# Patient Record
Sex: Male | Born: 2000 | Race: Black or African American | Hispanic: No | Marital: Single | Smoking: Never smoker
Health system: Southern US, Community
[De-identification: ages and names within clinical notes are randomized; demographics above are authoritative.]

## PROBLEM LIST (undated history)

## (undated) DIAGNOSIS — D573 Sickle-cell trait: Secondary | ICD-10-CM

---
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
## 2020-05-18 ENCOUNTER — Encounter: Payer: Self-pay | Admitting: Internal Medicine

## 2020-06-02 ENCOUNTER — Other Ambulatory Visit: Payer: Self-pay

## 2020-06-02 ENCOUNTER — Encounter: Payer: Self-pay | Admitting: Internal Medicine

## 2020-06-02 ENCOUNTER — Ambulatory Visit (INDEPENDENT_AMBULATORY_CARE_PROVIDER_SITE_OTHER): Payer: No Typology Code available for payment source | Admitting: Internal Medicine

## 2020-06-02 VITALS — Temp 98.8°F | Ht 76.0 in | Wt 189.0 lb

## 2020-06-02 DIAGNOSIS — Z8616 Personal history of COVID-19: Secondary | ICD-10-CM

## 2020-06-02 DIAGNOSIS — R509 Fever, unspecified: Secondary | ICD-10-CM | POA: Diagnosis not present

## 2020-06-02 DIAGNOSIS — D573 Sickle-cell trait: Secondary | ICD-10-CM

## 2020-06-02 NOTE — Progress Notes (Signed)
Virtual Visit via Video   This visit type was conducted due to national recommendations for restrictions regarding the COVID-19 Pandemic (e.g. social distancing) in an effort to limit this patient's exposure and mitigate transmission in our community.  Due to his co-morbid illnesses, this patient is at least at moderate risk for complications without adequate follow up.  This format is felt to be most appropriate for this patient at this time.  All issues noted in this document were discussed and addressed.  A limited physical exam was performed with this format.    This visit type was conducted due to national recommendations for restrictions regarding the COVID-19 Pandemic (e.g. social distancing) in an effort to limit this patient's exposure and mitigate transmission in our community.  Patients identity confirmed using two different identifiers.  This format is felt to be most appropriate for this patient at this time.  All issues noted in this document were discussed and addressed.  No physical exam was performed (except for noted visual exam findings with Video Visits).    Date:  06/02/2020   ID:  Charles Dillon, DOB 24-Apr-2000, MRN [id number]  Patient Location:  Home  Provider location:   Office    Chief Complaint:  "I need f/u after COVID"  History of Present Illness:    Charles Dillon is a 20 y.o. male who presents via video conferencing for a telehealth visit today.    The patient does not have symptoms concerning for COVID-19 infection (fever, chills, cough, or new shortness of breath).   He presents today for virtual visit, per his request. He is now living in [place], [place]. The patient is being seeing today for a covid f/u. He reports he got sick on 4/17 - he had to leave work early because he felt hot, chills, headachy and lightheaded. He went home and checked his temp the next day and temperature was 101. He went to [facility] and had COVID test which was  reportedly positive. He stayed out of work from 4/18 through 4/24. He did return to work on 4/25 but states he needs a return to work note. Since this illness, he has felt fine and without lingering symptoms.     Past Medical History:  Diagnosis Date  . Sickle cell trait ([facility])    History reviewed. No pertinent surgical history.   No outpatient medications have been marked as taking for the 06/02/20 encounter (Office Visit) with Dorothyann Peng, MD.     Allergies:   Patient has no known allergies.   Social History   Tobacco Use  . Smoking status: Never Smoker  . Smokeless tobacco: Never Used  Vaping Use  . Vaping Use: Never used  Substance Use Topics  . Alcohol use: No  . Drug use: Never     Family Hx: The patient's family history includes Early death in his father; Hypertension in his mother; Hyperthyroidism in his mother; Sickle cell anemia in his maternal grandmother.  ROS:   Please see the history of present illness.    Review of Systems  Constitutional: Negative.   Respiratory: Negative.   Cardiovascular: Negative.   Gastrointestinal: Negative.   Neurological: Negative.   Psychiatric/Behavioral: Negative.     All other systems reviewed and are negative.   Labs/Other Tests and Data Reviewed:    Recent Labs: No results found for requested labs within last 8760 hours.   Recent Lipid Panel No results found for: CHOL, TRIG, HDL, CHOLHDL, LDLCALC, LDLDIRECT  Wt Readings from Last 3 Encounters:  06/02/20 189 lb (85.7 kg) (87 %, Z= 1.12)*  09/17/19 178 lb (80.7 kg) (81 %, Z= 0.88)*  08/27/18 181 lb (82.1 kg) (87 %, Z= 1.11)*   * Growth percentiles are based on CDC (Boys, 2-20 Years) data.     Exam:    Vital Signs:  Temp 98.8 F (37.1 C) Comment: patient provided  Ht 6\' 4"  (1.93 m)   Wt 189 lb (85.7 kg) Comment: patient provided  BMI 23.01 kg/m     Physical Exam Vitals and nursing note reviewed.  Constitutional:      Appearance: Normal appearance.   HENT:     Head: Normocephalic and atraumatic.  Pulmonary:     Effort: Pulmonary effort is normal.  Musculoskeletal:     Cervical back: Normal range of motion.  Neurological:     Mental Status: He is alert and oriented to person, place, and time.  Psychiatric:        Mood and Affect: Affect normal.     ASSESSMENT & PLAN:    1. Febrile illness Comments: This has since resolved. I will request records from [facility] to include in his chart. I agree w/ previous mgmt.  He should start taking Vitamin D 2000 units daily along with vitamin C 1000mg  daily.   2. Sickle cell trait (HCC) Chronic, stable.    COVID-19 Education: The signs and symptoms of COVID-19 were discussed with the patient and how to seek care for testing (follow up with PCP or arrange E-visit).  The importance of social distancing was discussed today.  Patient Risk:   After full review of this patients clinical status, I feel that they are at least moderate risk at this time.  Time:   Today, I have spent 20 minutes/ seconds with the patient with telehealth technology discussing above diagnoses.     Medication Adjustments/Labs and Tests Ordered: Current medicines are reviewed at length with the patient today.  Concerns regarding medicines are outlined above.   Tests Ordered: No orders of the defined types were placed in this encounter.   Medication Changes: No orders of the defined types were placed in this encounter.   Disposition:  Follow up prn  Signed, , MD

## 2020-06-27 ENCOUNTER — Encounter: Payer: Self-pay | Admitting: Physician Assistant

## 2020-06-27 ENCOUNTER — Telehealth: Payer: No Typology Code available for payment source | Admitting: Physician Assistant

## 2020-06-27 DIAGNOSIS — Z20822 Contact with and (suspected) exposure to covid-19: Secondary | ICD-10-CM

## 2020-06-27 DIAGNOSIS — J029 Acute pharyngitis, unspecified: Secondary | ICD-10-CM

## 2020-06-27 NOTE — Progress Notes (Signed)
This appears to be a duplicate visit.   NOTE: If you entered your credit card information for this eVisit, you will not be charged. You may see a "hold" on your card for the $35 but that hold will drop off and you will not have a charge processed.   If you are having a true medical emergency please call 911.      For an urgent face to face visit, [place] has six urgent care centers for your convenience:     [facility] at [facility] Directions [phone] [address] [place], [place] [address] . 8 am - 4 pm Monday - Friday    [facility]) Get Driving Directions [phone] [address] [place], [place] [address] . 8 am to 8 pm Monday-Friday . 10 am to 6 pm [facility] Urgent [facility]) Get Driving Directions [phone]  [address] [place],  [place]  [address] . 8 am to 8 pm Monday-Friday . 8 am to 4 pm [facility] Urgent Care at [facility] Get Driving Directions [phone] [address] [place], [place] [address] . 8 am to 8 pm Monday-Friday . 8 am to 4 pm [facility]gent Care at [facility] Get Driving Directions  [phone] [address] [place], [place] [address] . 8 am to 8 pm Monday-Friday . 8 am to 4 pm [facility] Urgent Care at [facility] Directions [phone] [address], [address] [place], [place] [address] . 8 am to 8 pm Monday-Friday . 8 am to 4 pm Saturday-Sunday     Your MyChart E-visit questionnaire answers were reviewed by a board certified advanced clinical practitioner to complete your personal care plan based on your specific symptoms.  Thank you for using e-Visits.    I spent 5-10 minutes on review and completion of this note- Illa Level [facility]

## 2020-06-27 NOTE — Progress Notes (Signed)
E-Visit for Corona Virus Screening  Your current symptoms could be consistent with the coronavirus.  Many health care providers can now test patients at their office but not all are.  [facility] has multiple testing sites. For information on our COVID testing locations and hours go to [email or web address]  We are enrolling you in our MyChart Home Monitoring for COVID19 . Daily you will receive a questionnaire within the MyChart website. Our COVID 19 response team will be monitoring your responses daily.  Testing Information: The COVID-19 Community Testing sites are testing BY APPOINTMENT ONLY.  You can schedule online at [email or web address]  If you do not have access to a smart phone or computer you may call [phone] for an appointment.   Additional testing sites in the Community:  . For [facility] Testing sites in [facility]  FarmerBuys.com.au  . For Pop-up testing sites in [place]  [email or web address]  . For [facility] [email or web address]  . For [facility] testing in [place] and [employer] [email or web address]  . For [facility] testing in [facility]   [email or web address]  For  more information about community testing call [phone]   Please quarantine yourself while awaiting your test results. Please stay home for a minimum of 10 days from the first day of illness with improving symptoms and you have had 24 hours of no fever (without the use of Tylenol (Acetaminophen) Motrin (Ibuprofen) or any fever reducing medication).  Also - Do not get tested prior to returning to work because once you have had a positive test the test can stay  positive for more than a month in some cases.   You should wear a mask or cloth face covering over your nose and mouth if you must be around other people or animals, including pets (even at home). Try to stay at least 6 feet away from other people. This will protect the people around you.  Please continue good preventive care measures, including:  frequent hand-washing, avoid touching your face, cover coughs/sneezes, stay out of crowds and keep a 6 foot distance from others.  COVID-19 is a respiratory illness with symptoms that are similar to the flu. Symptoms are typically mild to moderate, but there have been cases of severe illness and death due to the virus.   The following symptoms may appear 2-14 days after exposure: . Fever . Cough . Shortness of breath or difficulty breathing . Chills . Repeated shaking with chills . Muscle pain . Headache . Sore throat . New loss of taste or smell . Fatigue . Congestion or runny nose . Nausea or vomiting . Diarrhea  Go to the nearest hospital ED for assessment if fever/cough/breathlessness are severe or illness seems like a threat to life.  It is vitally important that if you feel that you have an infection such as this virus or any other virus that you stay home and away from places where you may spread it to others.  You should avoid contact with people age 51 and older.        You may also take acetaminophen (Tylenol) as needed for fever.    Your symptoms indicate a likely viral infection (Pharyngitis).   Pharyngitis is inflammation in the back of the throat which can cause a sore throat, scratchiness and sometimes difficulty swallowing.   Pharyngitis is typically caused by a respiratory virus and will just run its course.  Please keep in mind that your symptoms could last up to 10 days.  For throat  pain, we recommend over the counter oral pain relief medications such as acetaminophen or aspirin, or anti-inflammatory medications such as  ibuprofen or naproxen sodium.  Topical treatments such as oral throat lozenges or sprays may be used as needed.  Avoid close contact with loved ones, especially the very young and elderly.  Remember to wash your hands thoroughly throughout the day as this is the number one way to prevent the spread of infection and wipe down door knobs and counters with disinfectant. If your sore throat continues please submit an Evisit, or have a face to face visit for further evaluation.    After careful review of your answers, I would not recommend and antibiotic for your condition.  Antibiotics should not be used to treat conditions that we suspect are caused by viruses like the virus that causes the common cold or flu. However, some people can have Strep with atypical symptoms. You may need formal testing in clinic or office to confirm if your symptoms continue or worsen.  Providers prescribe antibiotics to treat infections caused by bacteria. Antibiotics are very powerful in treating bacterial infections when they are used properly.  To maintain their effectiveness, they should be used only when necessary.  Overuse of antibiotics has resulted in the development of super bugs that are resistant to treatment!    I have also provided a work note.      Reduce your risk of any infection by using the same precautions used for avoiding the common cold or flu:  Marland Kitchen Wash your hands often with soap and warm water for at least 20 seconds.  If soap and water are not readily available, use an alcohol-based hand sanitizer with at least 60% alcohol.  . If coughing or sneezing, cover your mouth and nose by coughing or sneezing into the elbow areas of your shirt or coat, into a tissue or into your sleeve (not your hands). . Avoid shaking hands with others and consider head nods or verbal greetings only. . Avoid touching your eyes, nose, or mouth with unwashed hands.  . Avoid close contact with people who are sick. . Avoid  places or events with large numbers of people in one location, like concerts or sporting events. . Carefully consider travel plans you have or are making. . If you are planning any travel outside or inside the [place], visit the CDC's Travelers' Health webpage for the latest health notices. . If you have some symptoms but not all symptoms, continue to monitor at home and seek medical attention if your symptoms worsen. . If you are having a medical emergency, call 911.  HOME CARE . Only take medications as instructed by your medical team. . Drink plenty of fluids and get plenty of rest. . A steam or ultrasonic humidifier can help if you have congestion.   GET HELP RIGHT AWAY IF YOU HAVE EMERGENCY WARNING SIGNS** FOR COVID-19. If you or someone is showing any of these signs seek emergency medical care immediately. Call 911 or proceed to your closest emergency facility if: . You develop worsening high fever. . Trouble breathing . Bluish lips or face . Persistent pain or pressure in the chest . New confusion . Inability to wake or stay awake . You cough up blood. . Your symptoms become more severe  **This list is not all possible symptoms. Contact your medical provider for any symptoms that are sever or concerning to you.  MAKE SURE YOU   Understand these instructions.  Will watch  your condition.  Will get help right away if you are not doing well or get worse.  Your e-visit answers were reviewed by a board certified advanced clinical practitioner to complete your personal care plan.  Depending on the condition, your plan could have included both over the counter or prescription medications.  If there is a problem please reply once you have received a response from your provider.  Your safety is important to [place].  If you have drug allergies check your prescription carefully.    You can use MyChart to ask questions about today's visit, request a non-urgent call back, or ask for a work or  school excuse for 24 hours related to this e-Visit. If it has been greater than 24 hours you will need to follow up with your provider, or enter a new e-Visit to address those concerns. You will get an e-mail in the next two days asking about your experience.  I hope that your e-visit has been valuable and will speed your recovery. Thank you for using e-visits.   I spent 5-10 minutes on review and completion of this note- Illa Level [facility]

## 2022-03-08 DIAGNOSIS — J020 Streptococcal pharyngitis: Secondary | ICD-10-CM | POA: Diagnosis not present

## 2022-03-08 DIAGNOSIS — J029 Acute pharyngitis, unspecified: Secondary | ICD-10-CM | POA: Diagnosis not present

## 2022-03-08 DIAGNOSIS — Z6821 Body mass index (BMI) 21.0-21.9, adult: Secondary | ICD-10-CM | POA: Diagnosis not present

## 2022-03-09 ENCOUNTER — Other Ambulatory Visit (HOSPITAL_COMMUNITY): Payer: Self-pay

## 2022-03-09 MED ORDER — LIDOCAINE VISCOUS HCL 2 % MT SOLN
5.0000 mL | OROMUCOSAL | 1 refills | Status: AC
  Filled 2022-03-09: qty 120, 2d supply, fill #0

## 2022-05-17 DIAGNOSIS — Z6822 Body mass index (BMI) 22.0-22.9, adult: Secondary | ICD-10-CM | POA: Diagnosis not present

## 2022-05-17 DIAGNOSIS — Z23 Encounter for immunization: Secondary | ICD-10-CM | POA: Diagnosis not present

## 2022-05-17 DIAGNOSIS — Z1389 Encounter for screening for other disorder: Secondary | ICD-10-CM | POA: Diagnosis not present

## 2022-05-17 DIAGNOSIS — Z0000 Encounter for general adult medical examination without abnormal findings: Secondary | ICD-10-CM | POA: Diagnosis not present

## 2022-05-17 DIAGNOSIS — Z13220 Encounter for screening for lipoid disorders: Secondary | ICD-10-CM | POA: Diagnosis not present

## 2022-05-17 DIAGNOSIS — Z113 Encounter for screening for infections with a predominantly sexual mode of transmission: Secondary | ICD-10-CM | POA: Diagnosis not present

## 2023-01-02 DIAGNOSIS — J029 Acute pharyngitis, unspecified: Secondary | ICD-10-CM | POA: Diagnosis not present

## 2023-02-02 DIAGNOSIS — Z113 Encounter for screening for infections with a predominantly sexual mode of transmission: Secondary | ICD-10-CM | POA: Diagnosis not present

## 2023-02-02 DIAGNOSIS — Z6823 Body mass index (BMI) 23.0-23.9, adult: Secondary | ICD-10-CM | POA: Diagnosis not present

## 2023-02-07 ENCOUNTER — Other Ambulatory Visit (HOSPITAL_COMMUNITY): Payer: Self-pay

## 2023-04-02 DIAGNOSIS — R32 Unspecified urinary incontinence: Secondary | ICD-10-CM | POA: Diagnosis not present

## 2023-04-02 DIAGNOSIS — A5601 Chlamydial cystitis and urethritis: Secondary | ICD-10-CM | POA: Diagnosis not present

## 2023-04-02 DIAGNOSIS — Z202 Contact with and (suspected) exposure to infections with a predominantly sexual mode of transmission: Secondary | ICD-10-CM | POA: Diagnosis not present
